# Patient Record
Sex: Male | Born: 1985 | Hispanic: No | Marital: Single | Smoking: Current some day smoker
Health system: Southern US, Community
[De-identification: ages and names within clinical notes are randomized; demographics above are authoritative.]

---

## 2016-02-18 ENCOUNTER — Emergency Department (HOSPITAL_COMMUNITY)
Admission: EM | Admit: 2016-02-18 | Discharge: 2016-02-18 | Disposition: A | Discharge status: Home / Self Care | Payer: No Typology Code available for payment source | Attending: Emergency Medicine | Admitting: Emergency Medicine

## 2016-02-18 ENCOUNTER — Encounter (HOSPITAL_COMMUNITY): Payer: Self-pay | Admitting: *Deleted

## 2016-02-18 ENCOUNTER — Emergency Department (HOSPITAL_COMMUNITY): Payer: No Typology Code available for payment source

## 2016-02-18 DIAGNOSIS — F17200 Nicotine dependence, unspecified, uncomplicated: Secondary | ICD-10-CM | POA: Insufficient documentation

## 2016-02-18 DIAGNOSIS — Y92410 Unspecified street and highway as the place of occurrence of the external cause: Secondary | ICD-10-CM | POA: Diagnosis not present

## 2016-02-18 DIAGNOSIS — S161XXA Strain of muscle, fascia and tendon at neck level, initial encounter: Secondary | ICD-10-CM | POA: Insufficient documentation

## 2016-02-18 DIAGNOSIS — S199XXA Unspecified injury of neck, initial encounter: Secondary | ICD-10-CM | POA: Diagnosis present

## 2016-02-18 DIAGNOSIS — S39012A Strain of muscle, fascia and tendon of lower back, initial encounter: Secondary | ICD-10-CM | POA: Insufficient documentation

## 2016-02-18 DIAGNOSIS — Y999 Unspecified external cause status: Secondary | ICD-10-CM | POA: Diagnosis not present

## 2016-02-18 DIAGNOSIS — Y939 Activity, unspecified: Secondary | ICD-10-CM | POA: Diagnosis not present

## 2016-02-18 MED ORDER — HYDROCODONE-ACETAMINOPHEN 5-325 MG PO TABS: 1.0000 | ORAL_TABLET | Freq: Four times a day (QID) | ORAL | Status: AC | PRN

## 2016-02-18 MED ORDER — IBUPROFEN 600 MG PO TABS: 600.0000 mg | ORAL_TABLET | Freq: Four times a day (QID) | ORAL | Status: AC | PRN

## 2016-02-18 MED ORDER — CYCLOBENZAPRINE HCL 10 MG PO TABS: 10.0000 mg | ORAL_TABLET | Freq: Two times a day (BID) | ORAL | Status: AC | PRN
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
## 2016-09-17 ENCOUNTER — Emergency Department (HOSPITAL_COMMUNITY): Payer: Self-pay

## 2016-09-17 ENCOUNTER — Encounter (HOSPITAL_COMMUNITY): Payer: Self-pay

## 2016-09-17 ENCOUNTER — Emergency Department (HOSPITAL_COMMUNITY)
Admission: EM | Admit: 2016-09-17 | Discharge: 2016-09-17 | Disposition: A | Discharge status: Home / Self Care | Payer: Self-pay | Attending: Emergency Medicine | Admitting: Emergency Medicine

## 2016-09-17 DIAGNOSIS — R51 Headache: Secondary | ICD-10-CM | POA: Insufficient documentation

## 2016-09-17 DIAGNOSIS — Y990 Civilian activity done for income or pay: Secondary | ICD-10-CM | POA: Insufficient documentation

## 2016-09-17 DIAGNOSIS — Y9389 Activity, other specified: Secondary | ICD-10-CM | POA: Insufficient documentation

## 2016-09-17 DIAGNOSIS — Y9289 Other specified places as the place of occurrence of the external cause: Secondary | ICD-10-CM | POA: Insufficient documentation

## 2016-09-17 DIAGNOSIS — F17200 Nicotine dependence, unspecified, uncomplicated: Secondary | ICD-10-CM | POA: Insufficient documentation

## 2016-09-17 DIAGNOSIS — S0922XA Traumatic rupture of left ear drum, initial encounter: Secondary | ICD-10-CM

## 2016-09-17 DIAGNOSIS — H7292 Unspecified perforation of tympanic membrane, left ear: Secondary | ICD-10-CM | POA: Insufficient documentation

## 2016-09-17 MED ORDER — IBUPROFEN 400 MG PO TABS
400.0000 mg | ORAL_TABLET | Freq: Once | ORAL | Status: AC
  Administered 2016-09-17: 400 mg via ORAL
  Filled 2016-09-17: qty 1

## 2016-09-17 NOTE — ED Notes (Signed)
Patient transported to CT 
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
